# Patient Record
Sex: Female | Born: 1980 | Race: White | Hispanic: No | Marital: Married | Smoking: Never smoker
Health system: Southern US, Community
[De-identification: ages and names within clinical notes are randomized; demographics above are authoritative.]

## PROBLEM LIST (undated history)

## (undated) DIAGNOSIS — K219 Gastro-esophageal reflux disease without esophagitis: Secondary | ICD-10-CM

## (undated) DIAGNOSIS — F909 Attention-deficit hyperactivity disorder, unspecified type: Secondary | ICD-10-CM

## (undated) DIAGNOSIS — J302 Other seasonal allergic rhinitis: Secondary | ICD-10-CM

## (undated) HISTORY — PX: BREAST ENHANCEMENT SURGERY: SHX7

## (undated) HISTORY — DX: Gastro-esophageal reflux disease without esophagitis: K21.9

## (undated) HISTORY — PX: ABDOMINOPLASTY: SUR9

## (undated) HISTORY — DX: Other seasonal allergic rhinitis: J30.2

## (undated) HISTORY — DX: Attention-deficit hyperactivity disorder, unspecified type: F90.9

---

## 2005-10-17 ENCOUNTER — Observation Stay: Payer: Self-pay

## 2005-10-25 ENCOUNTER — Inpatient Hospital Stay: Payer: Self-pay | Admitting: Obstetrics and Gynecology

## 2008-02-05 ENCOUNTER — Inpatient Hospital Stay: Payer: Self-pay

## 2010-12-27 ENCOUNTER — Encounter: Payer: Self-pay | Admitting: Gastroenterology

## 2011-10-10 ENCOUNTER — Ambulatory Visit: Payer: Self-pay | Admitting: Obstetrics and Gynecology

## 2011-10-12 ENCOUNTER — Ambulatory Visit: Payer: Self-pay | Admitting: Obstetrics and Gynecology

## 2012-10-18 ENCOUNTER — Ambulatory Visit: Payer: Self-pay | Admitting: Obstetrics and Gynecology

## 2012-10-18 LAB — HEMOGLOBIN: HGB: 12 g/dL (ref 12.0–16.0)

## 2012-10-22 ENCOUNTER — Ambulatory Visit: Payer: Self-pay | Admitting: Obstetrics and Gynecology

## 2013-03-25 ENCOUNTER — Encounter: Payer: Self-pay | Admitting: Maternal & Fetal Medicine

## 2013-08-21 ENCOUNTER — Observation Stay: Payer: Self-pay | Admitting: Obstetrics and Gynecology

## 2013-09-19 ENCOUNTER — Ambulatory Visit: Payer: Self-pay | Admitting: Obstetrics and Gynecology

## 2013-09-19 LAB — CBC WITH DIFFERENTIAL/PLATELET
BASOS ABS: 0 10*3/uL (ref 0.0–0.1)
BASOS PCT: 0.2 %
Eosinophil #: 0.1 10*3/uL (ref 0.0–0.7)
Eosinophil %: 0.9 %
HCT: 32.3 % — AB (ref 35.0–47.0)
HGB: 10.8 g/dL — ABNORMAL LOW (ref 12.0–16.0)
LYMPHS ABS: 1.7 10*3/uL (ref 1.0–3.6)
Lymphocyte %: 18.2 %
MCH: 29.2 pg (ref 26.0–34.0)
MCHC: 33.5 g/dL (ref 32.0–36.0)
MCV: 87 fL (ref 80–100)
MONOS PCT: 5.8 %
Monocyte #: 0.5 x10 3/mm (ref 0.2–0.9)
NEUTROS ABS: 6.9 10*3/uL — AB (ref 1.4–6.5)
Neutrophil %: 74.9 %
PLATELETS: 211 10*3/uL (ref 150–440)
RBC: 3.71 10*6/uL — ABNORMAL LOW (ref 3.80–5.20)
RDW: 13.1 % (ref 11.5–14.5)
WBC: 9.2 10*3/uL (ref 3.6–11.0)

## 2013-09-20 ENCOUNTER — Inpatient Hospital Stay: Payer: Self-pay | Admitting: Obstetrics and Gynecology

## 2013-09-21 LAB — HEMATOCRIT: HCT: 27.8 % — ABNORMAL LOW (ref 35.0–47.0)

## 2014-12-19 NOTE — Op Note (Signed)
PATIENT NAME:  [name], Alicia Keller DATE OF BIRTH:  1980-11-01  DATE OF PROCEDURE:  10/22/2012  PREOPERATIVE DIAGNOSIS: Missed abortion.   POSTOPERATIVE DIAGNOSIS: Missed abortion.   PROCEDURE: Suction dilation and curettage.   SURGEON: Jennell Cornerhomas Schermerhorn, M.D.   ANESTHESIA: General endotracheal anesthesia.   INDICATIONS: This is a 34 year old gravida 3, para 2, a patient at 6510 plus 3 weeks estimated gestational age noted to have intrauterine pregnancy. Fetus measured 8 weeks without fetal heart motion. The patient's blood type is A negative.   DESCRIPTION OF PROCEDURE: After adequate general endotracheal anesthesia, the patient was placed in the dorsal supine position. The legs were placed in the candy-cane stirrups. Abdomen and perineum were prepped and draped in normal sterile fashion. The patient's bladder was catheterized with a red Robinson catheter yielding 50 mL urine. A weighted speculum was placed in the posterior vaginal vault and the anterior cervix was grasped with a single-tooth tenaculum. Cervix was dilated to #20 Hanks dilator without difficulty.  A #8 flexible suction curette placed in the endometrial cavity and tissue removed consistent with products of conception. Sharp curettage was performed with good uterine cry throughout and repeat suction curettage performed without additional tissue. Good hemostasis noted. Estimated blood loss 25 mL. Intraoperative fluids 600 mL. Urine output 50 mL. The patient was taken to the recovery room in good condition. ____________________________ Suzy Bouchardhomas J. Schermerhorn, MD tjs:sb D: 10/22/2012 10:20:31 ET     T: 10/22/2012 11:40:30 ET        JOB#: [id number] cc: Suzy Bouchardhomas J. Schermerhorn, MD, <Dictator> Suzy BouchardHOMAS J SCHERMERHORN MD ELECTRONICALLY SIGNED 10/30/2012 15:49

## 2014-12-20 NOTE — Op Note (Signed)
PATIENT NAME:  Roosevelt LocksDUDLEY, Alicia S MR#:  [id number] DATE OF BIRTH:  01-18-1981  DATE OF PROCEDURE:  09/20/2013  PREOPERATIVE DIAGNOSIS: Elective primary cesarean section at 39 + 0 weeks estimated gestational age.   POSTOPERATIVE DIAGNOSES: Elective primary cesarean section at 39 + 0 weeks estimated gestational age.   PROCEDURE:  1.  Primary low transverse cesarean section. 2.  On-Q pump placement.   ANESTHESIA: Spinal.  SURGEON: Jennell Cornerhomas Schermerhorn, M.D.   FIRST ASSISTANT: _Ore INDICATIONS: This is a 34 year old gravida 4, para 2 with EDC of 09/27/2012. The patient is  with 2 traumatic vaginal deliveries elects for primary low transverse cesarean section.   DESCRIPTION OF PROCEDURE: After 2 grams IV Ancef administered, the patient was placed in the dorsal supine position with a hip roll under the right side. The patient's abdomen was prepped and draped in normal sterile fashion. Pfannenstiel incision was made 2 fingerbreadths above the symphysis pubis. Sharp dissection was used to identify the fascia. The fascia was opened in the midline and the fascia was opened in a transverse fashion. The superior aspect of the fascia was grasped with Kocher clamps and the recti muscles dissected free. The inferior aspect of the fascia was grasped with Kocher clamps and the pyramidalis muscle was dissected free. Entry into the peritoneal cavity was accomplished sharply. The vesicouterine peritoneal fold was identified and a bladder flap was created and the bladder was reflected inferiorly. A low transverse uterine incision was made. Upon entry into the endometrial cavity, clear fluid resulted. Large fetal head was brought to the incision, a loose nuchal cord was reduced. The infant was delivered without complications. A vigorous female was passed to nursery staff who assigned Apgar scores of 9 and 9, weight 3290 grams. The placenta was manually delivered. The uterus was exteriorized and the endometrial cavity was  wiped clean with laparotomy tape. The cervix was opened with a ring forceps and this was passed off the operative field. The uterine incision was closed with 1 chromic suture in a running locking fashion. Several additional figure-of-eight sutures were used for hemostasis. Fallopian tubes and ovaries appeared normal. The posterior cul-de-sac was irrigated and suctioned and the uterus was placed back into the abdominal cavity. The paracolic gutters were wiped clean with laparotomy tape bilaterally. The uterine incision again appeared hemostatic. Interceed was placed over the uterine incision in a T-shaped fashion. The superior aspect of the fascia was then grasped with Kocher clamps and the On-Q pump catheters were advanced from an infraumbilical position to a silk fascial position. The fascia was then closed over the catheters with 0 Vicryl suture with good approximation of tissues. Good hemostasis was noted. Subcutaneous tissues were irrigated and bovied for hemostasis. The skin was reapproximated with the Insorb absorbable staples and the On-Q pump catheters were secured at the skin level with Dermabond and Steri-Stripped and then Tegaderm was placed over this. Each catheter was loaded with 5 mL of 0.5% Marcaine. There were no complications. Estimated blood loss 500 mL. Intraoperative fluids 1000 mL. The patient tolerated the procedure well and was taken to the recovery room in good condition.    ____________________________ Suzy Bouchardhomas J. Schermerhorn, MD tjs:dp D: 09/20/2013 08:35:05 ET T: 09/20/2013 08:52:43 ET JOB#: [id number]  cc: Suzy Bouchardhomas J. Schermerhorn, MD, <Dictator> Suzy BouchardHOMAS J SCHERMERHORN MD ELECTRONICALLY SIGNED 09/20/2013 16:50

## 2017-03-20 ENCOUNTER — Ambulatory Visit
Admission: RE | Admit: 2017-03-20 | Discharge: 2017-03-20 | Disposition: A | Discharge status: Home / Self Care | Payer: BLUE CROSS/BLUE SHIELD | Source: Ambulatory Visit | Attending: Family Medicine | Admitting: Family Medicine

## 2017-03-20 ENCOUNTER — Other Ambulatory Visit: Payer: Self-pay | Admitting: Family Medicine

## 2017-03-20 DIAGNOSIS — R05 Cough: Secondary | ICD-10-CM

## 2017-03-20 DIAGNOSIS — R053 Chronic cough: Secondary | ICD-10-CM

## 2020-01-13 ENCOUNTER — Other Ambulatory Visit: Payer: Self-pay | Admitting: Internal Medicine

## 2020-01-13 DIAGNOSIS — Z1231 Encounter for screening mammogram for malignant neoplasm of breast: Secondary | ICD-10-CM

## 2020-04-23 ENCOUNTER — Other Ambulatory Visit: Payer: Self-pay

## 2020-04-23 ENCOUNTER — Encounter: Payer: Self-pay | Admitting: Emergency Medicine

## 2020-04-23 ENCOUNTER — Ambulatory Visit
Admission: EM | Admit: 2020-04-23 | Discharge: 2020-04-23 | Disposition: A | Discharge status: Home / Self Care | Payer: Commercial Managed Care - PPO

## 2020-04-23 ENCOUNTER — Ambulatory Visit (INDEPENDENT_AMBULATORY_CARE_PROVIDER_SITE_OTHER): Payer: Commercial Managed Care - PPO

## 2020-04-23 DIAGNOSIS — S93402A Sprain of unspecified ligament of left ankle, initial encounter: Secondary | ICD-10-CM | POA: Diagnosis not present

## 2020-04-23 DIAGNOSIS — M25572 Pain in left ankle and joints of left foot: Secondary | ICD-10-CM

## 2020-04-23 DIAGNOSIS — M25472 Effusion, left ankle: Secondary | ICD-10-CM

## 2020-04-23 NOTE — Discharge Instructions (Addendum)
SPRAIN: Imaging negative for fractures today. Stressed avoiding painful activities . Reviewed RICE guidelines. Use medications as directed, including NSAIDs. If no NSAIDs have been prescribed for you today, you may take Aleve or Motrin over the counter. May use Tylenol in between doses of NSAIDs.  If no improvement in the next 1-2 weeks, f/u with PCP or return to our office for reexamination, and please feel free to call or return at any time for any questions or concerns you may have and we will be happy to help you!

## 2020-04-23 NOTE — ED Triage Notes (Signed)
Patient in today after injuring her left ankle while running yesterday. Patient states she was running on grass and stepped in a hole. Patient states she hear a pop.

## 2020-04-23 NOTE — ED Provider Notes (Signed)
[facility]    CSN: [id number] Arrival date & time: 04/23/20  0910      History   Chief Complaint Chief Complaint  Patient presents with  . Ankle Injury    DOI 04/22/20  . Ankle Pain    left    HPI Alicia Keller is a 39 y.o. female.   39 year old female presents for injury of the left ankle.  She says that she was running yesterday, tripped in a hole, fell, and heard a pop in the ankle.  She has had swelling and pain of left ankle since.  Patient states that her pain is 6 out of 10.  She has taken Motrin, ice the ankle, and elevated it.  She says she has some relief of pain but it still hurts to bear weight on the ankle/ foot.  Patient denies previous fracture or surgery on the affected extremity.  No numbness or tingling.  No other concerns today.     Past Medical History:  Diagnosis Date  . ADHD   . GERD (gastroesophageal reflux disease)   . Seasonal allergies     There are no problems to display for this patient.   Past Surgical History:  Procedure Laterality Date  . ABDOMINOPLASTY    . BREAST ENHANCEMENT SURGERY    . CESAREAN SECTION      OB History   No obstetric history on file.      Home Medications    Prior to Admission medications   Medication Sig Start Date End Date Taking? Authorizing Provider  amphetamine-dextroamphetamine (ADDERALL) 10 MG tablet Take 1 tablet by mouth daily as needed. 03/20/20  Yes [provider]  amphetamine-dextroamphetamine (ADDERALL) 20 MG tablet Take 1 tablet by mouth in the morning and at bedtime. 03/20/20  Yes [provider]  cetirizine (ZYRTEC) 10 MG tablet Take 1 tablet by mouth at bedtime. 01/08/20 01/07/21 Yes [provider]  fluticasone (FLONASE) 50 MCG/ACT nasal spray daily as needed. 05/16/19  Yes [provider]  levonorgestrel (MIRENA) 20 MCG/24HR IUD by Intrauterine route.   Yes [provider]  omeprazole (PRILOSEC) 40 MG capsule Take 1 capsule by mouth  in the morning and at bedtime. 02/13/20 02/12/21 Yes [provider]    Family History Family History  Problem Relation Age of Onset  . Factor V Leiden deficiency Mother   . Hypertension Mother   . Breast cancer Mother   . GER disease Mother   . Schizophrenia Father     Social History Social History   Tobacco Use  . Smoking status: Never Smoker  . Smokeless tobacco: Never Used  Vaping Use  . Vaping Use: Never used  Substance Use Topics  . Alcohol use: Yes    Comment: social  . Drug use: Never     Allergies   Amoxicillin and Doxycycline   Review of Systems Review of Systems  Musculoskeletal: Positive for arthralgias, gait problem and joint swelling.  Skin: Negative for rash and wound.  Neurological: Negative for weakness and numbness.  Hematological: Does not bruise/bleed easily.     Physical Exam Triage Vital Signs ED Triage Vitals  Enc Vitals Group     BP 04/23/20 1000 125/86     Pulse Rate 04/23/20 1000 86     Resp 04/23/20 1000 18     Temp 04/23/20 1000 98.6 F (37 C)     Temp Source 04/23/20 1000 Oral     SpO2 04/23/20 1000 100 %  Weight 04/23/20 1001 147 lb (66.7 kg)     Height 04/23/20 1001 5\' 2"  (1.575 m)     Head Circumference --      Peak Flow --      Pain Score 04/23/20 0959 6     Pain Loc --      Pain Edu? --      Excl. in GC? --    No data found.  Updated Vital Signs BP 125/86 (BP Location: Left Arm)   Pulse 86   Temp 98.6 F (37 C) (Oral)   Resp 18   Ht 5\' 2"  (1.575 m)   Wt 147 lb (66.7 kg)   SpO2 100%   BMI 26.89 kg/m       Physical Exam Vitals and nursing note reviewed.  Constitutional:      General: She is not in acute distress.    Appearance: Normal appearance. She is not ill-appearing or toxic-appearing.  HENT:     Head: Normocephalic and atraumatic.  Eyes:     General: No scleral icterus.       Right eye: No discharge.        Left eye: No discharge.     Conjunctiva/sclera: Conjunctivae normal.    Cardiovascular:     Rate and Rhythm: Normal rate.     Pulses: Normal pulses.  Pulmonary:     Effort: Pulmonary effort is normal. No respiratory distress.  Musculoskeletal:     Cervical back: Neck supple.     Right ankle: Normal.     Left ankle: Swelling (moderate swelling lateral ankle) present. No ecchymosis. Tenderness present over the lateral malleolus, medial malleolus and ATF ligament. Decreased range of motion. Normal pulse.  Skin:    General: Skin is dry.  Neurological:     General: No focal deficit present.     Mental Status: She is alert. Mental status is at baseline.     Motor: No weakness.     Gait: Gait abnormal.  Psychiatric:        Mood and Affect: Mood normal.        Behavior: Behavior normal.        Thought Content: Thought content normal.      UC Treatments / Results  Labs (all labs ordered are listed, but only abnormal results are displayed) Labs Reviewed - No data to display  EKG   Radiology DG Ankle Complete Left  Result Date: 04/23/2020 CLINICAL DATA:  Pain following fall into hole EXAM: LEFT ANKLE COMPLETE - 3+ VIEW COMPARISON:  None. FINDINGS: Frontal, oblique, and lateral views were obtained. There is soft tissue swelling laterally. No fracture or joint effusion evident. There is no appreciable joint space narrowing or erosion. Ankle mortise appears intact. There is an inferior calcaneal spur. IMPRESSION: Soft tissue swelling laterally. No fracture or appreciable arthropathy. Ankle mortise appears intact. There is an inferior calcaneal spur. Electronically Signed   By: III M.D.   On: 04/23/2020 10:30    Procedures Procedures (including critical care time)  Medications Ordered in UC Medications - No data to display  Initial Impression / Assessment and Plan / UC Course  I have reviewed the triage vital signs and the nursing notes.  Pertinent labs & imaging results that were available during my care of the patient were reviewed  by me and considered in my medical decision making (see chart for details).   Imaging negative for fracture today.  Patient given figure-of-eight lace up ankle brace.  Advised to  continue rest, ice, elevation, compression.  Continue Motrin and Tylenol for pain relief.  Follow-up as needed with our office or orthopedics.  Final Clinical Impressions(s) / UC Diagnoses   Final diagnoses:  Sprain of left ankle, unspecified ligament, initial encounter  Swelling of left ankle joint     Discharge Instructions     SPRAIN: Imaging negative for fractures today. Stressed avoiding painful activities . Reviewed RICE guidelines. Use medications as directed, including NSAIDs. If no NSAIDs have been prescribed for you today, you may take Aleve or Motrin over the counter. May use Tylenol in between doses of NSAIDs.  If no improvement in the next 1-2 weeks, f/u with PCP or return to our office for reexamination, and please feel free to call or return at any time for any questions or concerns you may have and we will be happy to help you!        ED Prescriptions    None     PDMP not reviewed this encounter.   Shirlee Latch, PA-C 04/23/20 1053

## 2021-01-18 IMAGING — CR DG ANKLE COMPLETE 3+V*L*
3 series · 3 of 3 positions shown · non-contrast
Comparison: None.

CLINICAL DATA: Pain following fall into hole

EXAM:
LEFT ANKLE COMPLETE - 3+ VIEW

[ankle ap]
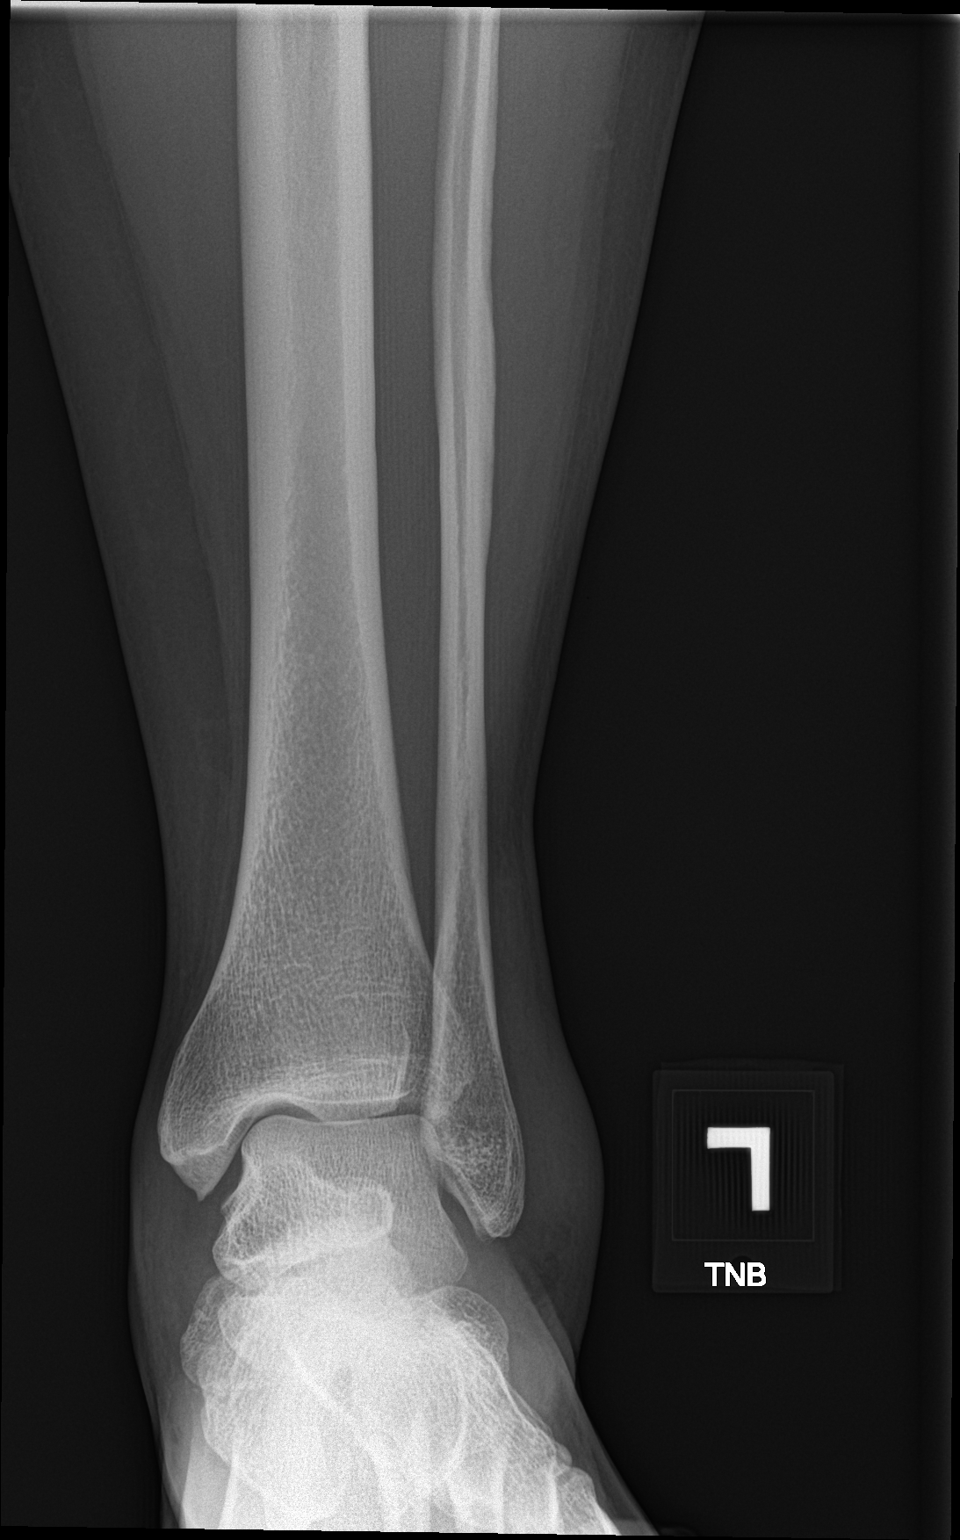

[ankle obl]
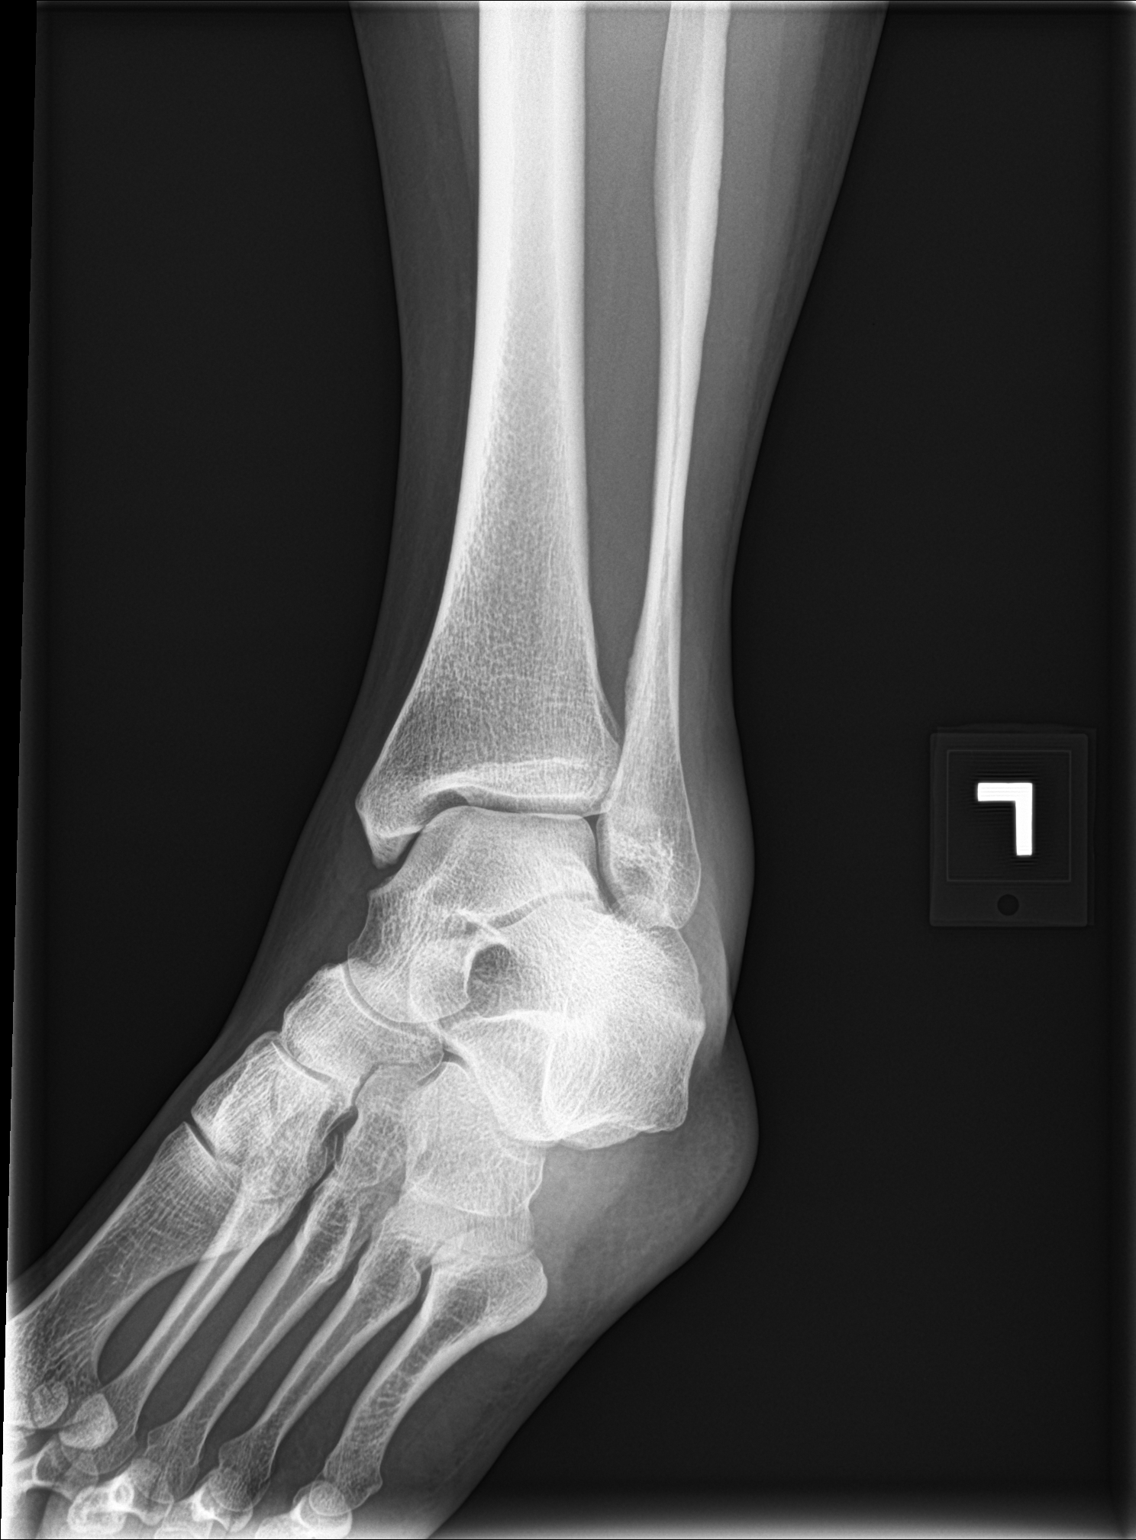

[ankle lat]
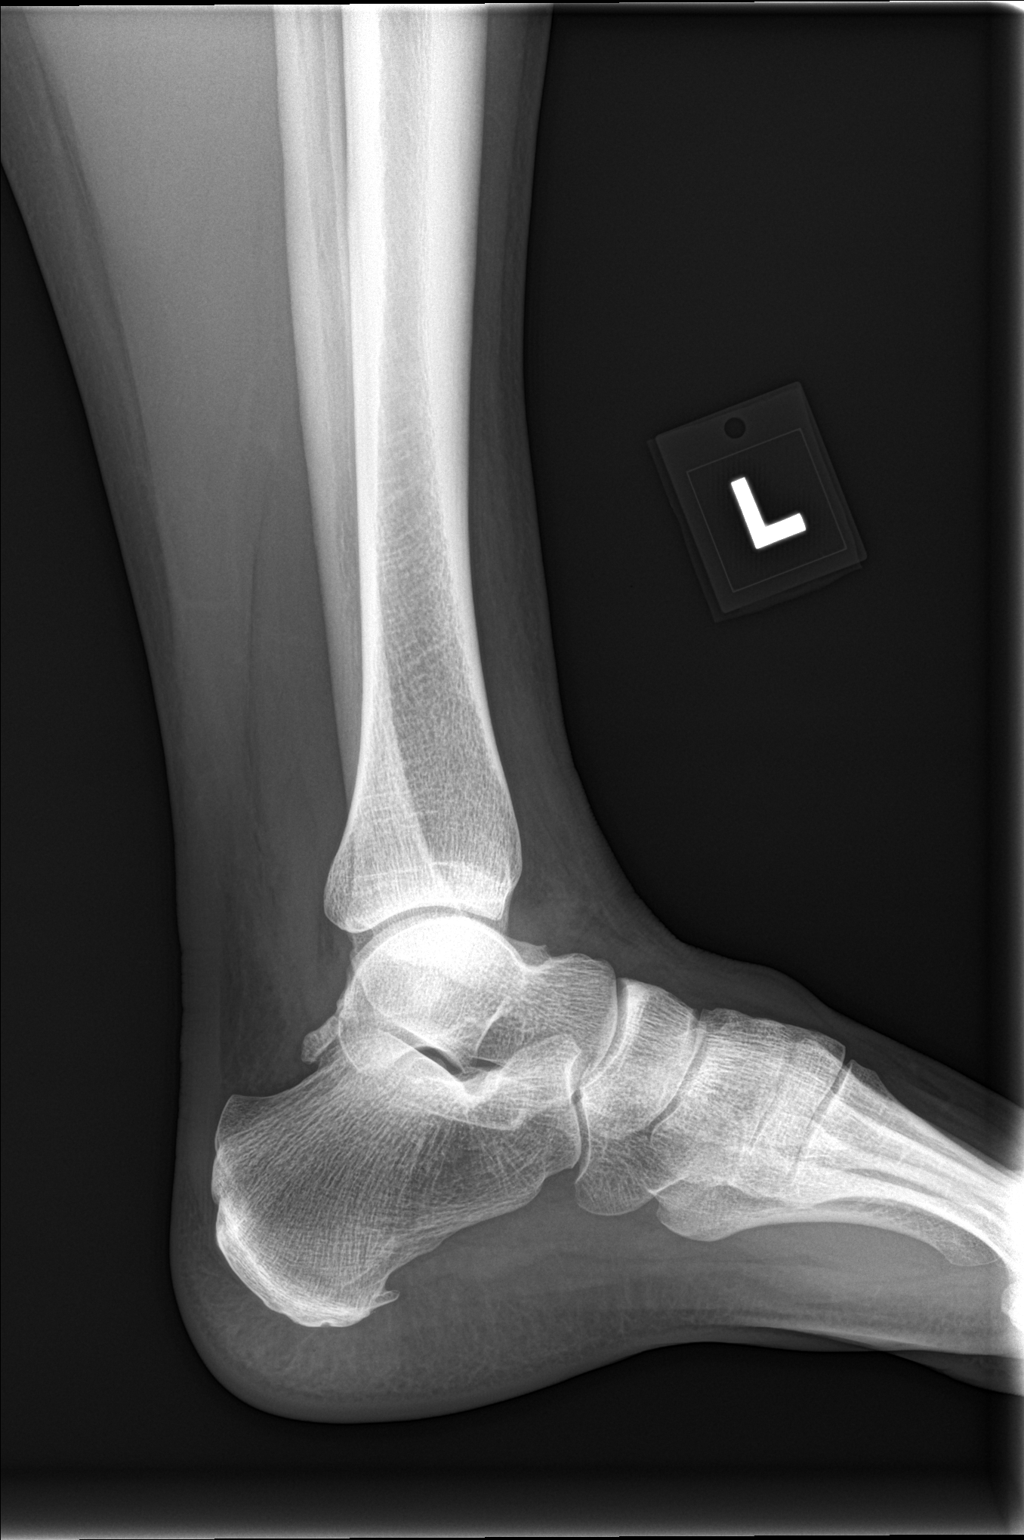

[3 of 3 positions shown; findings below may reference images not displayed]

FINDINGS: Frontal, oblique, and lateral views were obtained. There is soft
tissue swelling laterally. No fracture or joint effusion evident.
There is no appreciable joint space narrowing or erosion. Ankle
mortise appears intact. There is an inferior calcaneal spur.
IMPRESSION: Soft tissue swelling laterally. No fracture or appreciable
arthropathy. Ankle mortise appears intact. There is an inferior
calcaneal spur.

## 2021-03-09 ENCOUNTER — Other Ambulatory Visit: Payer: Self-pay | Admitting: Internal Medicine

## 2021-03-09 DIAGNOSIS — Z1231 Encounter for screening mammogram for malignant neoplasm of breast: Secondary | ICD-10-CM

## 2022-12-21 ENCOUNTER — Other Ambulatory Visit: Payer: Self-pay | Admitting: Internal Medicine

## 2022-12-21 DIAGNOSIS — Z1231 Encounter for screening mammogram for malignant neoplasm of breast: Secondary | ICD-10-CM

## 2023-06-23 ENCOUNTER — Other Ambulatory Visit: Payer: Self-pay | Admitting: Internal Medicine

## 2023-06-23 DIAGNOSIS — Z1231 Encounter for screening mammogram for malignant neoplasm of breast: Secondary | ICD-10-CM

## 2023-12-27 ENCOUNTER — Other Ambulatory Visit: Payer: Self-pay | Admitting: Internal Medicine

## 2023-12-27 DIAGNOSIS — Z1231 Encounter for screening mammogram for malignant neoplasm of breast: Secondary | ICD-10-CM
